# Patient Record
Sex: Male | Born: 1945 | Race: White | Marital: Married | Smoking: Never smoker
Health system: Southern US, Community
[De-identification: ages and names within clinical notes are randomized; demographics above are authoritative.]

## PROBLEM LIST (undated history)

## (undated) DIAGNOSIS — I10 Essential (primary) hypertension: Secondary | ICD-10-CM

## (undated) DIAGNOSIS — E785 Hyperlipidemia, unspecified: Secondary | ICD-10-CM

## (undated) HISTORY — PX: CHOLECYSTECTOMY: SHX55

## (undated) HISTORY — PX: REPLACEMENT TOTAL KNEE BILATERAL: SUR1225

---

## 2016-04-29 ENCOUNTER — Other Ambulatory Visit: Payer: Self-pay | Admitting: Orthopedic Surgery

## 2016-04-29 DIAGNOSIS — M67912 Unspecified disorder of synovium and tendon, left shoulder: Secondary | ICD-10-CM

## 2016-05-09 ENCOUNTER — Ambulatory Visit
Admission: RE | Admit: 2016-05-09 | Discharge: 2016-05-09 | Disposition: A | Discharge status: Home / Self Care | Payer: Worker's Compensation | Source: Ambulatory Visit | Attending: Orthopedic Surgery | Admitting: Orthopedic Surgery

## 2016-05-09 DIAGNOSIS — M67912 Unspecified disorder of synovium and tendon, left shoulder: Secondary | ICD-10-CM

## 2021-06-24 ENCOUNTER — Emergency Department (HOSPITAL_BASED_OUTPATIENT_CLINIC_OR_DEPARTMENT_OTHER)
Admission: EM | Admit: 2021-06-24 | Discharge: 2021-06-24 | Disposition: A | Discharge status: Home / Self Care | Payer: Medicare Other | Attending: Emergency Medicine | Admitting: Emergency Medicine

## 2021-06-24 ENCOUNTER — Encounter (HOSPITAL_BASED_OUTPATIENT_CLINIC_OR_DEPARTMENT_OTHER): Payer: Self-pay | Admitting: Emergency Medicine

## 2021-06-24 ENCOUNTER — Emergency Department (HOSPITAL_BASED_OUTPATIENT_CLINIC_OR_DEPARTMENT_OTHER): Payer: Medicare Other

## 2021-06-24 ENCOUNTER — Other Ambulatory Visit: Payer: Self-pay

## 2021-06-24 DIAGNOSIS — R001 Bradycardia, unspecified: Secondary | ICD-10-CM | POA: Insufficient documentation

## 2021-06-24 DIAGNOSIS — R0602 Shortness of breath: Secondary | ICD-10-CM | POA: Diagnosis present

## 2021-06-24 DIAGNOSIS — J90 Pleural effusion, not elsewhere classified: Secondary | ICD-10-CM

## 2021-06-24 DIAGNOSIS — R791 Abnormal coagulation profile: Secondary | ICD-10-CM | POA: Insufficient documentation

## 2021-06-24 DIAGNOSIS — R7989 Other specified abnormal findings of blood chemistry: Secondary | ICD-10-CM | POA: Diagnosis not present

## 2021-06-24 HISTORY — DX: Essential (primary) hypertension: I10

## 2021-06-24 HISTORY — DX: Hyperlipidemia, unspecified: E78.5

## 2021-06-24 LAB — CBC WITH DIFFERENTIAL/PLATELET
Abs Immature Granulocytes: 0.03 10*3/uL (ref 0.00–0.07)
Basophils Absolute: 0 10*3/uL (ref 0.0–0.1)
Basophils Relative: 1 %
Eosinophils Absolute: 0.2 10*3/uL (ref 0.0–0.5)
Eosinophils Relative: 3 %
HCT: 45.1 % (ref 39.0–52.0)
Hemoglobin: 15.1 g/dL (ref 13.0–17.0)
Immature Granulocytes: 0 %
Lymphocytes Relative: 12 %
Lymphs Abs: 0.9 10*3/uL (ref 0.7–4.0)
MCH: 30.2 pg (ref 26.0–34.0)
MCHC: 33.5 g/dL (ref 30.0–36.0)
MCV: 90.2 fL (ref 80.0–100.0)
Monocytes Absolute: 0.6 10*3/uL (ref 0.1–1.0)
Monocytes Relative: 8 %
Neutro Abs: 5.7 10*3/uL (ref 1.7–7.7)
Neutrophils Relative %: 76 %
Platelets: 256 10*3/uL (ref 150–400)
RBC: 5 MIL/uL (ref 4.22–5.81)
RDW: 13.2 % (ref 11.5–15.5)
WBC: 7.6 10*3/uL (ref 4.0–10.5)
nRBC: 0 % (ref 0.0–0.2)

## 2021-06-24 LAB — BASIC METABOLIC PANEL
Anion gap: 7 (ref 5–15)
BUN: 29 mg/dL — ABNORMAL HIGH (ref 8–23)
CO2: 27 mmol/L (ref 22–32)
Calcium: 9.7 mg/dL (ref 8.9–10.3)
Chloride: 105 mmol/L (ref 98–111)
Creatinine, Ser: 1.55 mg/dL — ABNORMAL HIGH (ref 0.61–1.24)
GFR, Estimated: 47 mL/min — ABNORMAL LOW (ref >60)
Glucose, Bld: 104 mg/dL — ABNORMAL HIGH (ref 70–99)
Potassium: 4.2 mmol/L (ref 3.5–5.1)
Sodium: 139 mmol/L (ref 135–145)

## 2021-06-24 LAB — D-DIMER, QUANTITATIVE: D-Dimer, Quant: 2.91 ug/mL-FEU — ABNORMAL HIGH (ref 0.00–0.50)

## 2021-06-24 LAB — TROPONIN I (HIGH SENSITIVITY)
Troponin I (High Sensitivity): 8 ng/L (ref <18)
Troponin I (High Sensitivity): 8 ng/L (ref <18)

## 2021-06-24 MED ORDER — IOHEXOL 350 MG/ML SOLN
80.0000 mL | Freq: Once | INTRAVENOUS | Status: AC | PRN
  Administered 2021-06-24: 80 mL via INTRAVENOUS

## 2021-06-24 MED ORDER — SODIUM CHLORIDE 0.9 % IV BOLUS
1000.0000 mL | Freq: Once | INTRAVENOUS | Status: DC
Start: 2021-06-24 — End: 2021-06-24

## 2021-06-24 MED ORDER — SODIUM CHLORIDE 0.9 % IV BOLUS
500.0000 mL | Freq: Once | INTRAVENOUS | Status: AC
  Administered 2021-06-24: 500 mL via INTRAVENOUS

## 2021-06-24 NOTE — ED Provider Notes (Signed)
?[facility] EMERGENCY DEPARTMENT ?Provider Note ? ? ?CSN: [id number] ?Arrival date & time: 06/24/21  1143 ? ?  ? ?History ? ?Chief Complaint  ?Patient presents with  ? Shortness of Breath  ? ? ?Erik Jenkins is a 76 y.o. male. ? ?Patient with history of coronary artery disease status post stenting presents to the emergency department today for evaluation of chest pain that has been occurring over the past 10 days with associated shortness of breath.  Patient states that the pain is worse with deep breathing and certain movements like twisting.  He feels the pain in his bilateral flanks, not in the anterior chest or back.  No cough or fevers.  No vomiting or diaphoresis.  Patient does report a trip to [place] and back over the past 10 days.  They drove and took a couple of breaks but not frequently.  He tried to see his PCP today but was referred to the emergency department.  He has been able to do normal activities but states that he has been taking it slower because when he has to exert himself, he takes deeper breaths, which causes pain.  Patient denies risk factors for pulmonary embolism including: unilateral leg swelling, history of DVT/PE/other blood clots, use of exogenous hormones, recent immobilizations, recent surgery, malignancy, hemoptysis.  ? ? ? ? ?  ? ?Home Medications ?Prior to Admission medications   ?Not on File  ?   ? ?Allergies    ?Patient has no known allergies.   ? ?Review of Systems   ?Review of Systems ? ?Physical Exam ?Updated Vital Signs ?BP 136/77   Pulse (!) 57   Temp 98 ?F (36.7 ?C) (Oral)   Resp 17   Ht 5\' 11"  (1.803 m)   Wt 77.1 kg   SpO2 96%   BMI 23.71 kg/m?  ? ?Physical Exam ?Vitals and nursing note reviewed.  ?Constitutional:   ?   Appearance: He is well-developed. He is not diaphoretic.  ?HENT:  ?   Head: Normocephalic and atraumatic.  ?   Mouth/Throat:  ?   Mouth: Mucous membranes are not dry.  ?Eyes:  ?   Conjunctiva/sclera: Conjunctivae normal.  ?Neck:  ?    Vascular: Normal carotid pulses. No carotid bruit or JVD.  ?   Trachea: Trachea normal. No tracheal deviation.  ?Cardiovascular:  ?   Rate and Rhythm: Regular rhythm. Bradycardia present.  ?   Pulses: No decreased pulses.     ?     Radial pulses are 2+ on the right side and 2+ on the left side.  ?   Heart sounds: Normal heart sounds, S1 normal and S2 normal. Heart sounds not distant. No murmur heard. ?Pulmonary:  ?   Effort: Pulmonary effort is normal. No respiratory distress.  ?   Breath sounds: Normal breath sounds. No wheezing, rhonchi or rales.  ?Chest:  ?   Chest wall: No tenderness.  ?Abdominal:  ?   General: Bowel sounds are normal.  ?   Palpations: Abdomen is soft.  ?   Tenderness: There is no abdominal tenderness. There is no guarding or rebound.  ?Musculoskeletal:  ?   Cervical back: Normal range of motion and neck supple. No muscular tenderness.  ?   Right lower leg: No tenderness. No edema.  ?   Left lower leg: No tenderness. No edema.  ?Skin: ?   General: Skin is warm and dry.  ?   Coloration: Skin is not pale.  ?Neurological:  ?  Mental Status: He is alert. Mental status is at baseline.  ?Psychiatric:     ?   Mood and Affect: Mood normal.  ? ? ?ED Results / Procedures / Treatments   ?Labs ?(all labs ordered are listed, but only abnormal results are displayed) ?Labs Reviewed  ?BASIC METABOLIC PANEL - Abnormal; Notable for the following components:  ?    Result Value  ? Glucose, Bld 104 (*)   ? BUN 29 (*)   ? Creatinine, Ser 1.55 (*)   ? GFR, Estimated 47 (*)   ? All other components within normal limits  ?D-DIMER, QUANTITATIVE - Abnormal; Notable for the following components:  ? D-Dimer, Quant 2.91 (*)   ? All other components within normal limits  ?CBC WITH DIFFERENTIAL/PLATELET  ?TROPONIN I (HIGH SENSITIVITY)  ?TROPONIN I (HIGH SENSITIVITY)  ? ? ?ED ECG REPORT ? ? Date: 06/24/2021 ? Rate: 54 ? Rhythm: sinus bradycardia, PAC ? QRS Axis: left ? Intervals: normal ? ST/T Wave abnormalities: normal ?  Conduction Disutrbances:none ? Narrative Interpretation:  ? Old EKG Reviewed: none available ? ?I have personally reviewed the EKG tracing and agree with the computerized printout as noted. ? ? ?Radiology ?DG Chest 2 View ? ?Result Date: 06/24/2021 ?CLINICAL DATA:  Provided history: Chest pain. Shortness of breath. Bilateral rib pain when taking deep breaths. EXAM: CHEST - 2 VIEW COMPARISON:  No pertinent prior exams available for comparison. FINDINGS: Heart size within normal limits. Aortic atherosclerosis. Elevation of the left hemidiaphragm with associated left basilar atelectasis. Small bilateral pleural effusions. No evidence of pneumothorax. Multilevel compression fractures within the mid to lower thoracic spine. IMPRESSION: Elevation of the left hemidiaphragm with left basilar atelectasis. Small bilateral pleural effusions. Aortic Atherosclerosis (ICD10-I70.0). Multilevel compression fractures within the mid to lower thoracic spine. Electronically Signed   By: Jackey LogeKyle  Golden D.O.   On: 06/24/2021 15:17  ? ?CT Angio Chest PE W and/or Wo Contrast ? ?Result Date: 06/24/2021 ?CLINICAL DATA:  Pulmonary embolism (PE) suspected, positive D-dimer EXAM: CT ANGIOGRAPHY CHEST WITH CONTRAST TECHNIQUE: Multidetector CT imaging of the chest was performed using the standard protocol during bolus administration of intravenous contrast. Multiplanar CT image reconstructions and MIPs were obtained to evaluate the vascular anatomy. RADIATION DOSE REDUCTION: This exam was performed according to the departmental dose-optimization program which includes automated exposure control, adjustment of the mA and/or kV according to patient size and/or use of iterative reconstruction technique. CONTRAST:  80mL OMNIPAQUE IOHEXOL 350 MG/ML SOLN COMPARISON:  None Available. FINDINGS: Cardiovascular: Main pulmonary artery and its branches up to the segmental divisions are well opacified without filling defect. There is no cardiomegaly or pericardial  effusion. Thoracic aorta has a normal morphology and measures 3.6 cm in greatest diameter in the ascending segment. Mediastinum/Nodes: No mass or significant mediastinal lymphadenopathy. Thyroid has a normal appearance. Apparent small hiatal hernia. Lungs/Pleura: Mild right and moderate left pleural effusion. Mild basilar atelectasis. No focal consolidation or discrete nodule seen. Upper Abdomen: 2.4 cm hypodense lesion with some peripheral enhancement in the posterior right lobe of the liver (image 98 of series 4). Status post cholecystectomy. Large stool in the ascending colon. Musculoskeletal: Moderately severe thoracic spondylosis with some decrease in the height of the midthoracic vertebrae. Prominent marginal osteophytes. Vacuum disc phenomenon at mid to lower thoracic intervertebral disc spaces. Review of the MIP images confirms the above findings. IMPRESSION: No PE seen. Mild right and moderate left pleural effusion with mild left basilar atelectasis. 2.4 cm hypodense lesion with some peripheral enhancement in  the posterior right lobe of the liver (segment 6), likely a hemangioma. Recommend CT or MRI of the abdomen as per the liver mass protocol. Electronically Signed   By: Marjo Bicker M.D.   On: 06/24/2021 17:30   ? ?Procedures ?Procedures  ? ? ?Medications Ordered in ED ?Medications - No data to display ? ?ED Course/ Medical Decision Making/ A&P ?  ? ?Patient seen and examined. History obtained directly from patient.  ? ?Labs/EKG: Ordered CBC, BMP, troponin, D-dimer.  EKG ordered. ? ?Imaging: Ordered chest x-ray ? ?Medications/Fluids: None ordered ? ?Most recent vital signs reviewed and are as follows: ?BP (!) 148/72   Pulse 64   Temp 98 ?F (36.7 ?C) (Oral)   Resp 17   Ht 5\' 11"  (1.803 m)   Wt 77.1 kg   SpO2 97%   BMI 23.71 kg/m?  ? ?Initial impression: Pleuritic chest pain, laterally ? ?3:17 PM EKG reviewed.  ? ? ?Reassessment performed. Patient appears comfortable. ? ?Labs personally reviewed  and interpreted including: Troponin normal at 8; CBC unremarkable; BMP with creatinine 1.55, BUN 29, potassium 4.2; D-dimer was elevated at 2.91. ? ?Imaging personally visualized and interpreted including: Chest x-ray, agree

## 2021-06-24 NOTE — Discharge Instructions (Signed)
Your work-up today for shortness of breath showed pleural effusions in your lungs.  This is fluid buildup between the lung and the chest cavity.  You had a CT scan which did not show pneumonia or blood clots in the lungs.  We DO NOT see any suspicious masses or lesions which could be cancerous.  You had blood work which did not show signs of stress on the heart or heart attack. ? ?We feel that it is safe for you to go home today because you are not having severe shortness of breath or low oxygen levels.  It will be very important for you to follow-up with your primary care doctor and your cardiologist for further evaluation of the possible causes of pleural effusions.  Please give them a call tomorrow and schedule follow-up appointment. ?

## 2021-06-24 NOTE — ED Triage Notes (Signed)
Pt complains for SOB x 10 days hurts sides to take a deep breath. Denies any chest pain, denies cough, denies injury.  ?

## 2022-12-07 IMAGING — CT CT ANGIO CHEST
3 of 9 series · 18 of 36 positions shown · IV contrast (Omnipaque)
Comparison: None Available.

CLINICAL DATA: Pulmonary embolism (PE) suspected, positive D-dimer

EXAM:
CT ANGIOGRAPHY CHEST WITH CONTRAST
TECHNIQUE: Multidetector CT imaging of the chest was performed using the
standard protocol during bolus administration of intravenous
contrast. Multiplanar CT image reconstructions and MIPs were
obtained to evaluate the vascular anatomy.

[Series 5: pe lung · axial · 0.97mm/px · z∈[+412,+508]mm · 2 of 96 slices shown]
[im 32/96  mediastinal]
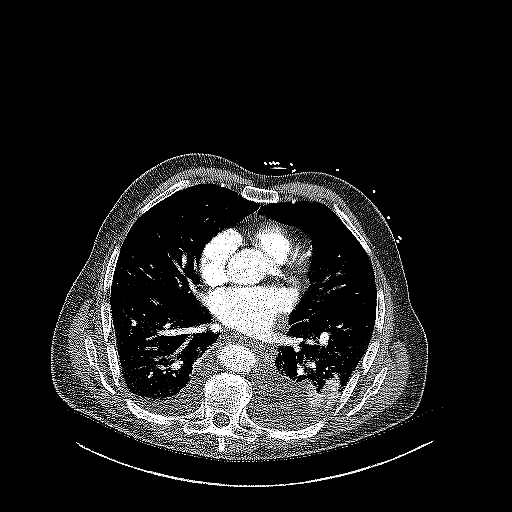
[im 64/96  mediastinal]
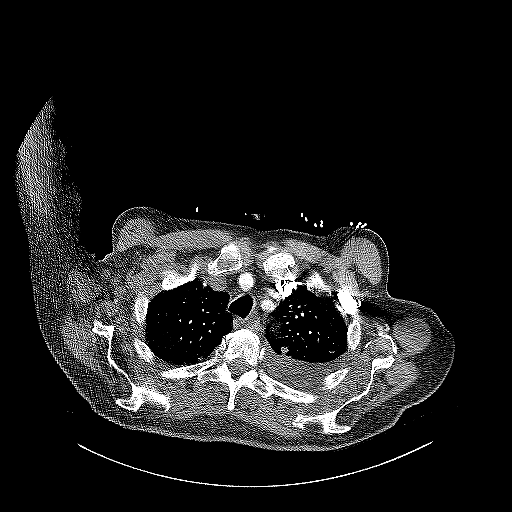

[Series 6: pe thins · axial · 0.97mm/px · z∈[+307,+584]mm · 15 of 317 slices shown]
[im 20/317  lung]
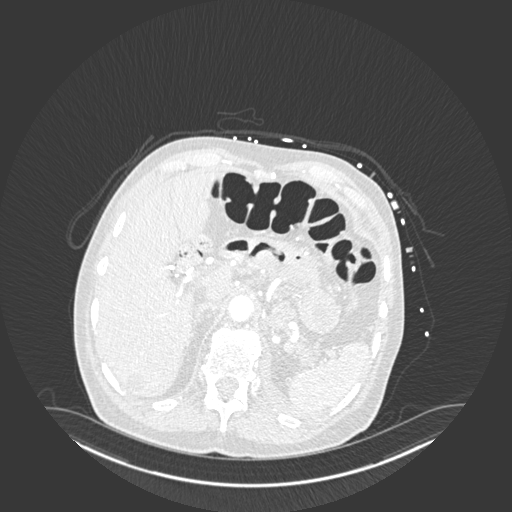
[im 40/317  mediastinal]
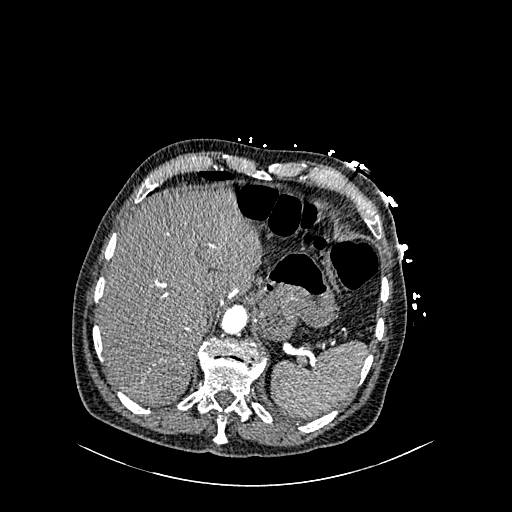
[im 60/317  lung]
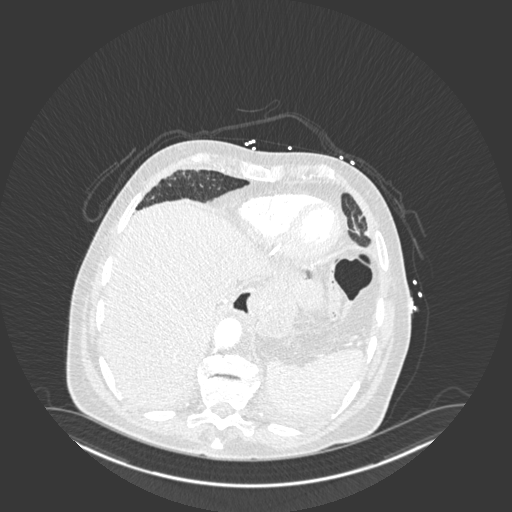
[im 80/317  mediastinal]
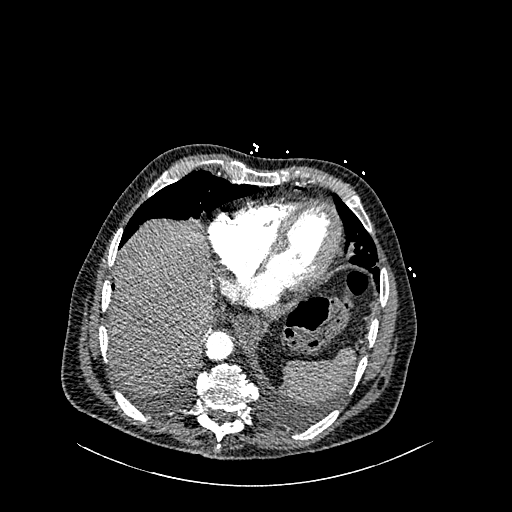
[im 99/317  lung]
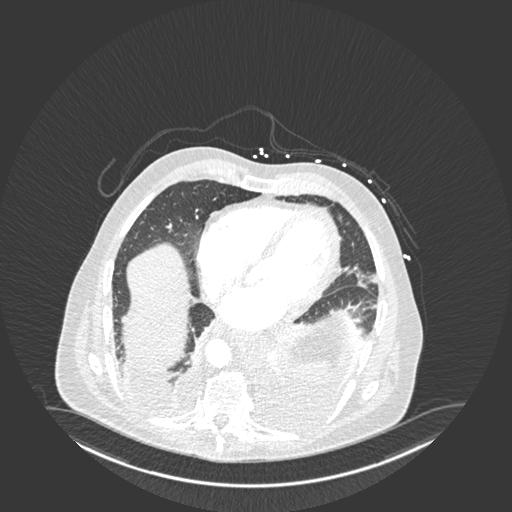
[im 119/317  mediastinal]
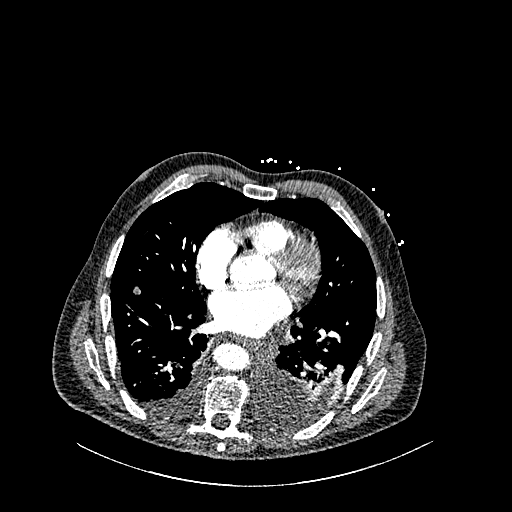
[im 139/317  lung]
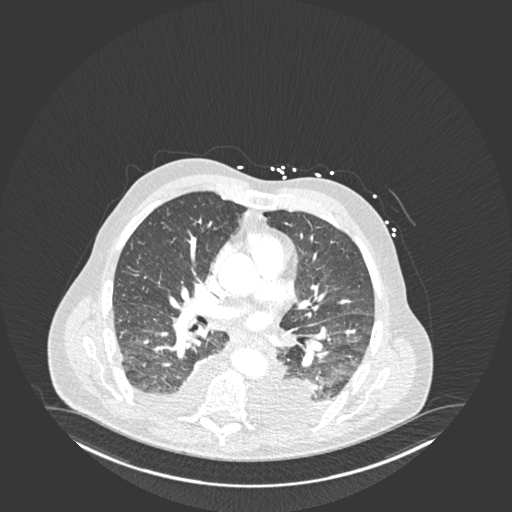
[im 159/317  mediastinal]
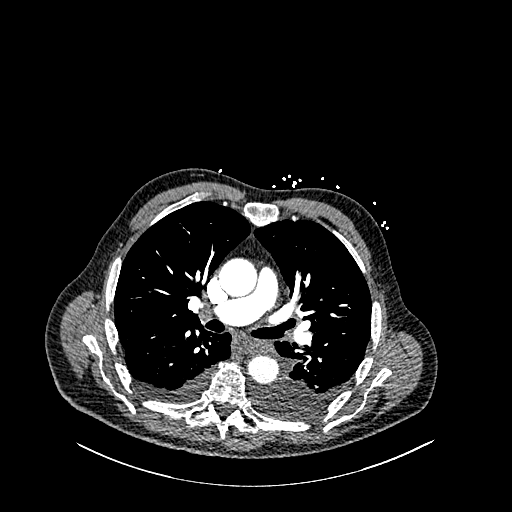
[im 178/317  lung]
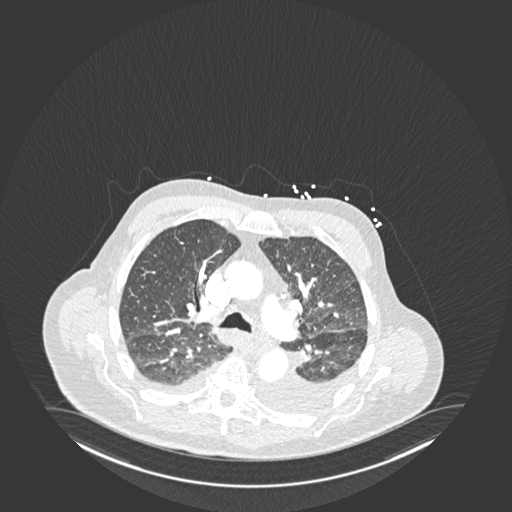
[im 198/317  mediastinal]
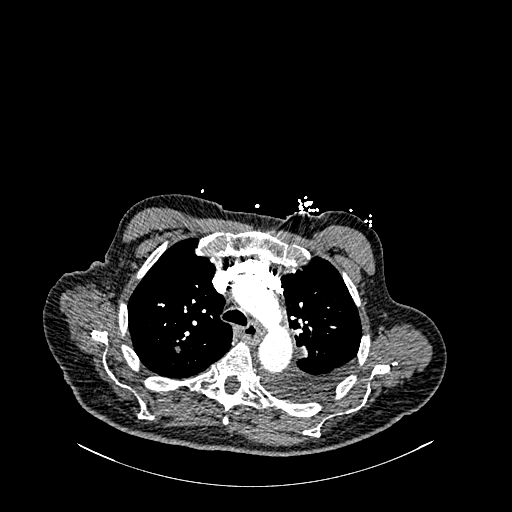
[im 218/317  lung]
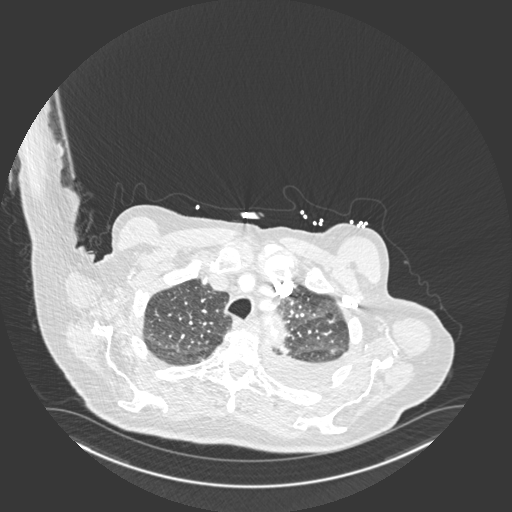
[im 238/317  mediastinal]
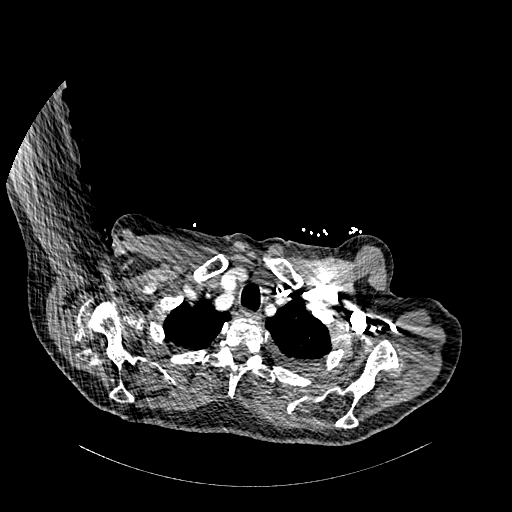
[im 257/317  lung]
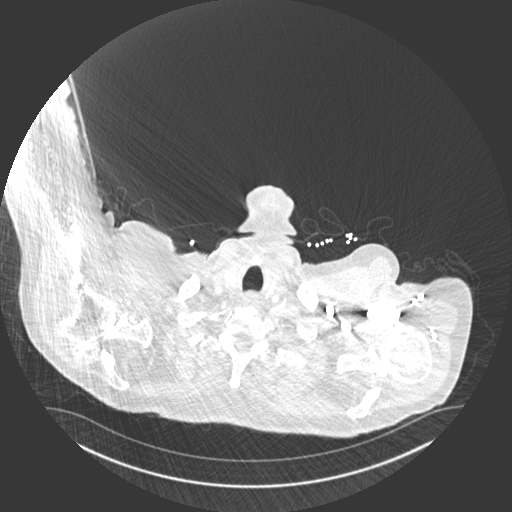
[im 277/317  mediastinal]
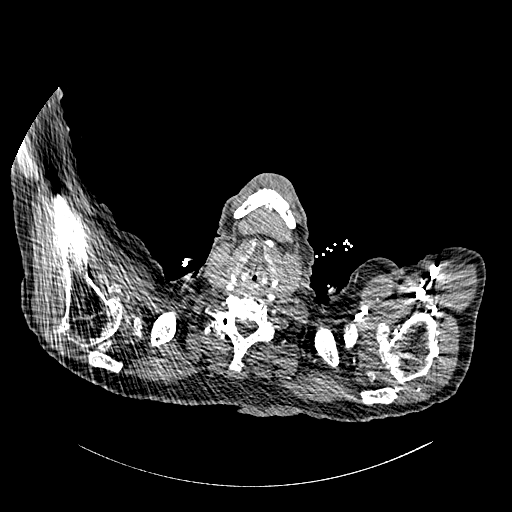
[im 297/317  lung]
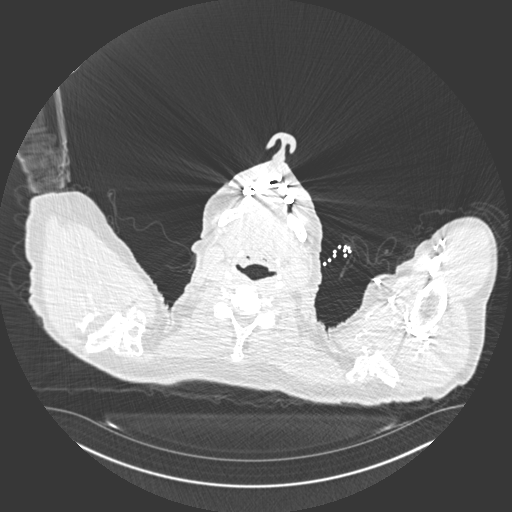

[Series 7: pe coronal mpr · coronal · 0.62mm/px · 1 of 151 slices shown]
[im 76/151  mediastinal]
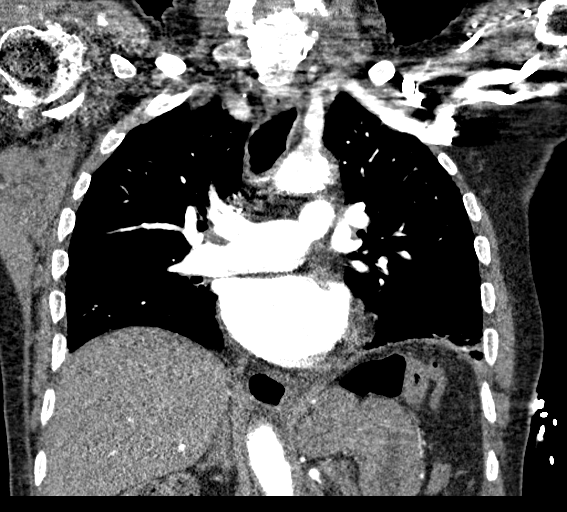

[18 of 36 positions shown; findings below may reference images not displayed]

RADIATION DOSE REDUCTION: This exam was performed according to the
departmental dose-optimization program which includes automated
exposure control, adjustment of the mA and/or kV according to
patient size and/or use of iterative reconstruction technique.

CONTRAST:  80mL OMNIPAQUE IOHEXOL 350 MG/ML SOLN
FINDINGS: Cardiovascular: Main pulmonary artery and its branches up to the
segmental divisions are well opacified without filling defect. There
is no cardiomegaly or pericardial effusion. Thoracic aorta has a
normal morphology and measures 3.6 cm in greatest diameter in the
ascending segment.

Mediastinum/Nodes: No mass or significant mediastinal
lymphadenopathy. Thyroid has a normal appearance. Apparent small
hiatal hernia.

Lungs/Pleura: Mild right and moderate left pleural effusion. Mild
basilar atelectasis. No focal consolidation or discrete nodule seen.

Upper Abdomen: 2.4 cm hypodense lesion with some peripheral
enhancement in the posterior right lobe of the liver (image 98 of
series 4). Status post cholecystectomy. Large stool in the ascending
colon.

Musculoskeletal: Moderately severe thoracic spondylosis with some
decrease in the height of the midthoracic vertebrae. Prominent
marginal osteophytes. Vacuum disc phenomenon at mid to lower
thoracic intervertebral disc spaces.

Review of the MIP images confirms the above findings.
IMPRESSION: No PE seen.

Mild right and moderate left pleural effusion with mild left basilar
atelectasis.

2.4 cm hypodense lesion with some peripheral enhancement in the
posterior right lobe of the liver (segment 6), likely a hemangioma.
Recommend CT or MRI of the abdomen as per the liver mass protocol.
